# Patient Record
Sex: Male | Born: 1965 | Race: White | Hispanic: No | Marital: Married | Smoking: Never smoker
Health system: Southern US, Community
[De-identification: ages and names within clinical notes are randomized; demographics above are authoritative.]

## PROBLEM LIST (undated history)

## (undated) DIAGNOSIS — E119 Type 2 diabetes mellitus without complications: Secondary | ICD-10-CM

## (undated) DIAGNOSIS — E785 Hyperlipidemia, unspecified: Secondary | ICD-10-CM

## (undated) DIAGNOSIS — I10 Essential (primary) hypertension: Secondary | ICD-10-CM

## (undated) HISTORY — PX: HERNIA REPAIR: SHX51

---

## 2000-12-20 ENCOUNTER — Emergency Department (HOSPITAL_COMMUNITY): Admission: EM | Admit: 2000-12-20 | Discharge: 2000-12-20 | Payer: Self-pay

## 2000-12-23 ENCOUNTER — Emergency Department (HOSPITAL_COMMUNITY): Admission: EM | Admit: 2000-12-23 | Discharge: 2000-12-23 | Payer: Self-pay | Admitting: Emergency Medicine

## 2000-12-27 ENCOUNTER — Encounter (HOSPITAL_COMMUNITY): Admission: RE | Admit: 2000-12-27 | Discharge: 2001-03-27 | Payer: Self-pay | Admitting: Emergency Medicine

## 2002-02-11 ENCOUNTER — Encounter: Admission: RE | Admit: 2002-02-11 | Discharge: 2002-02-11 | Payer: Self-pay | Admitting: Family Medicine

## 2002-02-11 ENCOUNTER — Encounter: Payer: Self-pay | Admitting: Family Medicine

## 2008-04-20 ENCOUNTER — Encounter: Admission: RE | Admit: 2008-04-20 | Discharge: 2008-04-20 | Payer: Self-pay | Admitting: Family Medicine

## 2009-07-19 ENCOUNTER — Ambulatory Visit: Payer: Self-pay | Admitting: Pulmonary Disease

## 2009-07-19 DIAGNOSIS — Z9089 Acquired absence of other organs: Secondary | ICD-10-CM | POA: Insufficient documentation

## 2009-07-19 DIAGNOSIS — G4733 Obstructive sleep apnea (adult) (pediatric): Secondary | ICD-10-CM

## 2009-07-19 DIAGNOSIS — J309 Allergic rhinitis, unspecified: Secondary | ICD-10-CM | POA: Insufficient documentation

## 2011-06-13 ENCOUNTER — Other Ambulatory Visit: Payer: Self-pay | Admitting: Family Medicine

## 2011-06-13 DIAGNOSIS — N644 Mastodynia: Secondary | ICD-10-CM

## 2011-06-19 ENCOUNTER — Ambulatory Visit
Admission: RE | Admit: 2011-06-19 | Discharge: 2011-06-19 | Disposition: A | Discharge status: Home / Self Care | Payer: BC Managed Care – PPO | Source: Ambulatory Visit | Attending: Family Medicine | Admitting: Family Medicine

## 2011-06-19 DIAGNOSIS — N644 Mastodynia: Secondary | ICD-10-CM

## 2018-10-14 ENCOUNTER — Emergency Department (HOSPITAL_BASED_OUTPATIENT_CLINIC_OR_DEPARTMENT_OTHER): Payer: BLUE CROSS/BLUE SHIELD

## 2018-10-14 ENCOUNTER — Emergency Department (HOSPITAL_BASED_OUTPATIENT_CLINIC_OR_DEPARTMENT_OTHER)
Admission: EM | Admit: 2018-10-14 | Discharge: 2018-10-14 | Disposition: A | Discharge status: Home / Self Care | Payer: BLUE CROSS/BLUE SHIELD | Attending: Emergency Medicine | Admitting: Emergency Medicine

## 2018-10-14 ENCOUNTER — Other Ambulatory Visit: Payer: Self-pay

## 2018-10-14 ENCOUNTER — Encounter (HOSPITAL_BASED_OUTPATIENT_CLINIC_OR_DEPARTMENT_OTHER): Payer: Self-pay | Admitting: *Deleted

## 2018-10-14 DIAGNOSIS — S79922A Unspecified injury of left thigh, initial encounter: Secondary | ICD-10-CM | POA: Diagnosis present

## 2018-10-14 DIAGNOSIS — M79652 Pain in left thigh: Secondary | ICD-10-CM

## 2018-10-14 DIAGNOSIS — E785 Hyperlipidemia, unspecified: Secondary | ICD-10-CM | POA: Diagnosis not present

## 2018-10-14 DIAGNOSIS — M898X5 Other specified disorders of bone, thigh: Secondary | ICD-10-CM

## 2018-10-14 DIAGNOSIS — I10 Essential (primary) hypertension: Secondary | ICD-10-CM | POA: Diagnosis not present

## 2018-10-14 DIAGNOSIS — Y998 Other external cause status: Secondary | ICD-10-CM | POA: Insufficient documentation

## 2018-10-14 DIAGNOSIS — Y92410 Unspecified street and highway as the place of occurrence of the external cause: Secondary | ICD-10-CM | POA: Diagnosis not present

## 2018-10-14 DIAGNOSIS — Y93I9 Activity, other involving external motion: Secondary | ICD-10-CM | POA: Insufficient documentation

## 2018-10-14 HISTORY — DX: Essential (primary) hypertension: I10

## 2018-10-14 HISTORY — DX: Type 2 diabetes mellitus without complications: E11.9

## 2018-10-14 HISTORY — DX: Hyperlipidemia, unspecified: E78.5
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
## 2018-10-14 NOTE — Discharge Instructions (Addendum)
Please rest and elevate the leg for the next couple days You can use ice to reduce pain and swelling for the first 24 hours and then switch to heat Use crutches as needed Return if you are worsening

## 2018-10-14 NOTE — ED Notes (Signed)
Pt amb in lobby
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
## 2023-03-06 ENCOUNTER — Other Ambulatory Visit (HOSPITAL_BASED_OUTPATIENT_CLINIC_OR_DEPARTMENT_OTHER): Payer: Self-pay

## 2023-03-06 MED ORDER — MOUNJARO 2.5 MG/0.5ML ~~LOC~~ SOAJ
2.5000 mg | SUBCUTANEOUS | 0 refills | Status: DC
  Filled 2023-03-06: qty 2, 28d supply, fill #0

## 2023-03-06 MED ORDER — METFORMIN HCL 1000 MG PO TABS
1000.0000 mg | ORAL_TABLET | Freq: Two times a day (BID) | ORAL | 0 refills | Status: DC
  Filled 2023-03-06: qty 180, 90d supply, fill #0

## 2023-03-26 ENCOUNTER — Other Ambulatory Visit (HOSPITAL_BASED_OUTPATIENT_CLINIC_OR_DEPARTMENT_OTHER): Payer: Self-pay

## 2023-03-26 MED ORDER — MOUNJARO 2.5 MG/0.5ML ~~LOC~~ SOAJ
2.5000 mg | SUBCUTANEOUS | 0 refills | Status: AC
  Filled 2023-03-26 – 2023-04-02 (×6): qty 2, 28d supply, fill #0

## 2023-03-30 ENCOUNTER — Other Ambulatory Visit (HOSPITAL_BASED_OUTPATIENT_CLINIC_OR_DEPARTMENT_OTHER): Payer: Self-pay

## 2023-04-02 ENCOUNTER — Other Ambulatory Visit: Payer: Self-pay

## 2023-04-02 ENCOUNTER — Other Ambulatory Visit (HOSPITAL_BASED_OUTPATIENT_CLINIC_OR_DEPARTMENT_OTHER): Payer: Self-pay

## 2023-08-02 ENCOUNTER — Other Ambulatory Visit (HOSPITAL_BASED_OUTPATIENT_CLINIC_OR_DEPARTMENT_OTHER): Payer: Self-pay

## 2023-08-02 MED ORDER — METFORMIN HCL 1000 MG PO TABS
1000.0000 mg | ORAL_TABLET | Freq: Two times a day (BID) | ORAL | 0 refills | Status: DC
  Filled 2023-08-02: qty 180, 90d supply, fill #0

## 2023-10-29 ENCOUNTER — Other Ambulatory Visit (HOSPITAL_BASED_OUTPATIENT_CLINIC_OR_DEPARTMENT_OTHER): Payer: Self-pay

## 2023-10-30 ENCOUNTER — Other Ambulatory Visit (HOSPITAL_BASED_OUTPATIENT_CLINIC_OR_DEPARTMENT_OTHER): Payer: Self-pay

## 2023-10-30 MED ORDER — METFORMIN HCL 1000 MG PO TABS
1000.0000 mg | ORAL_TABLET | Freq: Two times a day (BID) | ORAL | 0 refills | Status: DC
  Filled 2023-10-30: qty 180, 90d supply, fill #0

## 2024-01-01 ENCOUNTER — Other Ambulatory Visit (HOSPITAL_BASED_OUTPATIENT_CLINIC_OR_DEPARTMENT_OTHER): Payer: Self-pay

## 2024-01-01 MED ORDER — METFORMIN HCL 1000 MG PO TABS
1000.0000 mg | ORAL_TABLET | Freq: Two times a day (BID) | ORAL | 1 refills | Status: DC
  Filled 2024-01-01 – 2024-01-28 (×2): qty 180, 90d supply, fill #0
  Filled 2024-05-01: qty 180, 90d supply, fill #1

## 2024-01-28 ENCOUNTER — Other Ambulatory Visit (HOSPITAL_BASED_OUTPATIENT_CLINIC_OR_DEPARTMENT_OTHER): Payer: Self-pay

## 2024-07-31 ENCOUNTER — Other Ambulatory Visit (HOSPITAL_BASED_OUTPATIENT_CLINIC_OR_DEPARTMENT_OTHER): Payer: Self-pay

## 2024-07-31 MED ORDER — METFORMIN HCL 1000 MG PO TABS
1000.0000 mg | ORAL_TABLET | Freq: Two times a day (BID) | ORAL | 1 refills | Status: AC
  Filled 2024-07-31: qty 180, 90d supply, fill #0
  Filled 2024-10-27: qty 180, 90d supply, fill #1

## 2024-08-01 ENCOUNTER — Other Ambulatory Visit (HOSPITAL_COMMUNITY): Payer: Self-pay

## 2024-08-01 ENCOUNTER — Other Ambulatory Visit (HOSPITAL_BASED_OUTPATIENT_CLINIC_OR_DEPARTMENT_OTHER): Payer: Self-pay

## 2024-10-27 ENCOUNTER — Other Ambulatory Visit (HOSPITAL_BASED_OUTPATIENT_CLINIC_OR_DEPARTMENT_OTHER): Payer: Self-pay

## 2024-12-17 ENCOUNTER — Other Ambulatory Visit: Payer: Self-pay
